# Patient Record
Sex: Female | Born: 1990 | Race: Asian | Hispanic: No | Marital: Married | Smoking: Never smoker
Health system: Southern US, Community
[De-identification: ages and names within clinical notes are randomized; demographics above are authoritative.]

## PROBLEM LIST (undated history)

## (undated) DIAGNOSIS — K219 Gastro-esophageal reflux disease without esophagitis: Secondary | ICD-10-CM

## (undated) DIAGNOSIS — R519 Headache, unspecified: Secondary | ICD-10-CM

## (undated) DIAGNOSIS — R51 Headache: Secondary | ICD-10-CM

## (undated) HISTORY — DX: Gastro-esophageal reflux disease without esophagitis: K21.9

## (undated) HISTORY — DX: Headache, unspecified: R51.9

## (undated) HISTORY — DX: Headache: R51

---

## 2018-03-01 ENCOUNTER — Emergency Department (HOSPITAL_COMMUNITY)
Admission: EM | Admit: 2018-03-01 | Discharge: 2018-03-01 | Disposition: A | Discharge status: Home / Self Care | Payer: Self-pay | Attending: Emergency Medicine | Admitting: Emergency Medicine

## 2018-03-01 ENCOUNTER — Encounter (HOSPITAL_COMMUNITY): Payer: Self-pay | Admitting: Emergency Medicine

## 2018-03-01 DIAGNOSIS — R519 Headache, unspecified: Secondary | ICD-10-CM

## 2018-03-01 DIAGNOSIS — R51 Headache: Secondary | ICD-10-CM | POA: Insufficient documentation

## 2018-03-01 MED ORDER — SODIUM CHLORIDE 0.9 % IV BOLUS
1000.0000 mL | Freq: Once | INTRAVENOUS | Status: AC
  Administered 2018-03-01: 1000 mL via INTRAVENOUS

## 2018-03-01 MED ORDER — DEXAMETHASONE SODIUM PHOSPHATE 10 MG/ML IJ SOLN
10.0000 mg | Freq: Once | INTRAMUSCULAR | Status: AC
  Administered 2018-03-01: 10 mg via INTRAVENOUS
  Filled 2018-03-01: qty 1

## 2018-03-01 MED ORDER — KETOROLAC TROMETHAMINE 30 MG/ML IJ SOLN
30.0000 mg | Freq: Once | INTRAMUSCULAR | Status: AC
  Administered 2018-03-01: 30 mg via INTRAVENOUS
  Filled 2018-03-01: qty 1

## 2018-03-01 MED ORDER — BUTALBITAL-APAP-CAFFEINE 50-325-40 MG PO TABS: 1.0000 | ORAL_TABLET | Freq: Three times a day (TID) | ORAL | 0 refills | Status: AC | PRN

## 2018-03-01 MED ORDER — METOCLOPRAMIDE HCL 5 MG/ML IJ SOLN
10.0000 mg | INTRAMUSCULAR | Status: AC
  Administered 2018-03-01: 10 mg via INTRAVENOUS
  Filled 2018-03-01: qty 2

## 2018-03-01 NOTE — ED Triage Notes (Signed)
Pt reports HA X several weeks, has been seen at [facility] for same and was told "nothing is wrong"

## 2018-03-01 NOTE — ED Provider Notes (Signed)
[facility] Provider Note   CSN: [id number] Arrival date & time: 03/01/18  0104    History   Chief Complaint Chief Complaint  Patient presents with  . Headache    HPI Allison Chan is a 27 y.o. female.  27 year old female presents to the emergency department for evaluation of headache.  She has had an intermittent headache over the past several weeks.  It is throbbing in nature and radiates from her occiput to her frontal region.  She notes intermittent dizziness as well as blurry vision.  Symptoms also associated with nausea.  She has not had any vomiting.  The patient was diagnosed a triptan following initial ED visit for evaluation of headache.  She has been taking this medication, but states it provides little relief.  She denies any recent head injury, trauma, fevers, extremity numbness or paresthesias.  She has also had a recent head CT which did not show any concerning process.  Patient states that she last had a similar headache many years ago.     History reviewed. No pertinent past medical history.  There are no active problems to display for this patient.   History reviewed. No pertinent surgical history.   OB History   None      Home Medications    Prior to Admission medications   Medication Sig Start Date End Date Taking? Authorizing Provider  butalbital-acetaminophen-caffeine (FIORICET, ESGIC) 323 120 267750-325-40 MG tablet Take 1-2 tablets by mouth every 8 (eight) hours as needed for headache. 03/01/18 03/01/19  Antony MaduraHumes, Tenley Winward, PA-C    Family History No family history on file.  Social History Social History   Tobacco Use  . Smoking status: Never Smoker  . Smokeless tobacco: Never Used  Substance Use Topics  . Alcohol use: Not Currently  . Drug use: Not Currently     Allergies   Patient has no known allergies.   Review of Systems Review of Systems Ten systems reviewed and are negative for acute change, except as noted  in the HPI.    Physical Exam Updated Vital Signs BP 120/80   Pulse 86   Temp 98.4 F (36.9 C) (Oral)   Resp 18   Ht 4\' 8"  (1.422 m)   Wt 49.9 kg (110 lb)   SpO2 100%   BMI 24.66 kg/m   Physical Exam  Constitutional: She is oriented to person, place, and time. She appears well-developed and well-nourished. No distress.  Nontoxic appearing and in NAD  HENT:  Head: Normocephalic and atraumatic.  Mouth/Throat: Oropharynx is clear and moist.  Symmetric rise of the uvula with phonation.  Eyes: Pupils are equal, round, and reactive to light. Conjunctivae and EOM are normal. No scleral icterus.  Neck: Normal range of motion.  No nuchal rigidity or meningismus  Cardiovascular: Normal rate, regular rhythm and intact distal pulses.  Pulmonary/Chest: Effort normal. No respiratory distress.  Respirations even and unlabored  Musculoskeletal: Normal range of motion.  Neurological: She is alert and oriented to person, place, and time. No cranial nerve deficit. She exhibits normal muscle tone. Coordination normal.  GCS 15. Speech is goal oriented. No cranial nerve deficits appreciated; symmetric eyebrow raise, no facial drooping, tongue midline. Patient has equal grip strength bilaterally with 5/5 strength against resistance in all major muscle groups bilaterally. Sensation to light touch intact. Patient moves extremities without ataxia. Patient ambulatory with steady gait.  Skin: Skin is warm and dry. No rash noted. She is not diaphoretic. No erythema. No pallor.  Psychiatric: She has a normal mood and affect. Her behavior is normal.  Nursing note and vitals reviewed.    ED Treatments / Results  Labs (all labs ordered are listed, but only abnormal results are displayed) Labs Reviewed - No data to display  EKG None  Radiology No results found.  Procedures Procedures (including critical care time)  Medications Ordered in ED Medications  metoCLOPramide (REGLAN) injection 10 mg (10  mg Intravenous Given 03/01/18 0505)  ketorolac (TORADOL) 30 MG/ML injection 30 mg (30 mg Intravenous Given 03/01/18 0503)  sodium chloride 0.9 % bolus 1,000 mL (0 mLs Intravenous Stopped 03/01/18 0627)  dexamethasone (DECADRON) injection 10 mg (10 mg Intravenous Given 03/01/18 0651)    6:57 AM Patient reassessed.  Reports that headache has completely resolved.  No clinical decompensation.  Vitals stable.   Initial Impression / Assessment and Plan / ED Course  I have reviewed the triage vital signs and the nursing notes.  Pertinent labs & imaging results that were available during my care of the patient were reviewed by me and considered in my medical decision making (see chart for details).     Patient presents to the emergency department for evaluation of headache which began 2 weeks ago.  Patient with no history of recent head injury or trauma.  No fever, nuchal rigidity, meningismus to suggest meningitis.  Neurologic exam today is nonfocal.  She had a normal, reassuring head CT 1 week ago.  Has been seen in the ED x 2 previously for similar complaints.  On reassessment, the patient has had significant improvement in headache symptoms following a migraine cocktail.  I do not believe further emergent workup is indicated at this time.  Return precautions discussed and provided.  Patient discharged in stable condition with no unaddressed concerns.   Final Clinical Impressions(s) / ED Diagnoses   Final diagnoses:  Bad headache    ED Discharge Orders        Ordered    butalbital-acetaminophen-caffeine (FIORICET, ESGIC) 50-325-40 MG tablet  Every 8 hours PRN     03/01/18 0653       Antony Madura, PA-C 03/01/18 0733    Palumbo, April, MD 03/01/18 [phone]

## 2018-03-15 ENCOUNTER — Emergency Department (HOSPITAL_COMMUNITY)
Admission: EM | Admit: 2018-03-15 | Discharge: 2018-03-15 | Disposition: A | Discharge status: Home / Self Care | Payer: Self-pay | Attending: Emergency Medicine | Admitting: Emergency Medicine

## 2018-03-15 ENCOUNTER — Emergency Department (HOSPITAL_COMMUNITY): Payer: Self-pay

## 2018-03-15 ENCOUNTER — Encounter (HOSPITAL_COMMUNITY): Payer: Self-pay

## 2018-03-15 ENCOUNTER — Other Ambulatory Visit: Payer: Self-pay

## 2018-03-15 DIAGNOSIS — K2900 Acute gastritis without bleeding: Secondary | ICD-10-CM | POA: Insufficient documentation

## 2018-03-15 DIAGNOSIS — Z79899 Other long term (current) drug therapy: Secondary | ICD-10-CM | POA: Insufficient documentation

## 2018-03-15 LAB — CBC WITH DIFFERENTIAL/PLATELET
Abs Immature Granulocytes: 0 10*3/uL (ref 0.0–0.1)
Basophils Absolute: 0 10*3/uL (ref 0.0–0.1)
Basophils Relative: 0 %
Eosinophils Absolute: 0.3 10*3/uL (ref 0.0–0.7)
Eosinophils Relative: 5 %
HCT: 34 % — ABNORMAL LOW (ref 36.0–46.0)
Hemoglobin: 10.8 g/dL — ABNORMAL LOW (ref 12.0–15.0)
Immature Granulocytes: 0 %
Lymphocytes Relative: 34 %
Lymphs Abs: 2 10*3/uL (ref 0.7–4.0)
MCH: 25.1 pg — ABNORMAL LOW (ref 26.0–34.0)
MCHC: 31.8 g/dL (ref 30.0–36.0)
MCV: 79.1 fL (ref 78.0–100.0)
Monocytes Absolute: 0.4 10*3/uL (ref 0.1–1.0)
Monocytes Relative: 7 %
Neutro Abs: 3.3 10*3/uL (ref 1.7–7.7)
Neutrophils Relative %: 54 %
Platelets: 219 10*3/uL (ref 150–400)
RBC: 4.3 MIL/uL (ref 3.87–5.11)
RDW: 14.9 % (ref 11.5–15.5)
WBC: 6 10*3/uL (ref 4.0–10.5)

## 2018-03-15 LAB — URINALYSIS, ROUTINE W REFLEX MICROSCOPIC
Bilirubin Urine: NEGATIVE
Glucose, UA: NEGATIVE mg/dL
Hgb urine dipstick: NEGATIVE
Ketones, ur: NEGATIVE mg/dL
Leukocytes, UA: NEGATIVE
Nitrite: NEGATIVE
Protein, ur: NEGATIVE mg/dL
Specific Gravity, Urine: 1.004 — ABNORMAL LOW (ref 1.005–1.030)
pH: 6 (ref 5.0–8.0)

## 2018-03-15 LAB — COMPREHENSIVE METABOLIC PANEL
ALT: 13 U/L (ref 0–44)
AST: 16 U/L (ref 15–41)
Albumin: 3.9 g/dL (ref 3.5–5.0)
Alkaline Phosphatase: 88 U/L (ref 38–126)
Anion gap: 6 (ref 5–15)
BUN: 6 mg/dL (ref 6–20)
CO2: 26 mmol/L (ref 22–32)
Calcium: 9.3 mg/dL (ref 8.9–10.3)
Chloride: 110 mmol/L (ref 98–111)
Creatinine, Ser: 0.59 mg/dL (ref 0.44–1.00)
GFR calc Af Amer: >60 mL/min (ref >60)
GFR calc non Af Amer: >60 mL/min (ref >60)
Glucose, Bld: 92 mg/dL (ref 70–99)
Potassium: 3.3 mmol/L — ABNORMAL LOW (ref 3.5–5.1)
Sodium: 142 mmol/L (ref 135–145)
Total Bilirubin: 0.5 mg/dL (ref 0.3–1.2)
Total Protein: 6.8 g/dL (ref 6.5–8.1)

## 2018-03-15 LAB — LIPASE, BLOOD: Lipase: 39 U/L (ref 11–51)

## 2018-03-15 LAB — I-STAT BETA HCG BLOOD, ED (MC, WL, AP ONLY): I-stat hCG, quantitative: <5 m[IU]/mL (ref <5)

## 2018-03-15 MED ORDER — SODIUM CHLORIDE 0.9 % IV BOLUS
1000.0000 mL | Freq: Once | INTRAVENOUS | Status: AC
  Administered 2018-03-15: 1000 mL via INTRAVENOUS

## 2018-03-15 MED ORDER — SUCRALFATE 1 G PO TABS
1.0000 g | ORAL_TABLET | Freq: Once | ORAL | Status: AC
  Administered 2018-03-15: 1 g via ORAL
  Filled 2018-03-15: qty 1

## 2018-03-15 MED ORDER — IOHEXOL 300 MG/ML  SOLN
100.0000 mL | Freq: Once | INTRAMUSCULAR | Status: AC | PRN
  Administered 2018-03-15: 100 mL via INTRAVENOUS

## 2018-03-15 MED ORDER — SUCRALFATE 1 G PO TABS: 1.0000 g | ORAL_TABLET | Freq: Three times a day (TID) | ORAL | 0 refills | Status: AC

## 2018-03-15 NOTE — ED Triage Notes (Signed)
Pt reports having abdominal pain and burning since Thursday. Pt has been taking omeprazole with no relief.

## 2018-03-15 NOTE — ED Notes (Signed)
Patient transported to CT 

## 2018-03-15 NOTE — ED Provider Notes (Signed)
[facility] pending.  Patient here complaining of epigastric discomfort and burning.  2 days of omeprazole without improvement.  Patient's CT showed physiologic ovarian cyst but no other abdominal pathology.  Mention was made of small bilateral pleural effusions are nonspecific but patient is denying any shortness of breath cough or chest pain.  Encourage patient to continue the omeprazole as well as she was given Carafate.  She was given follow-up with PCP if symptoms do not improve and recommended that she see GI if symptoms persist.  Patient and family given strict return precautions.   Gwyneth SproutPlunkett, Jaryn Rosko, MD 03/15/18 [phone]

## 2018-03-15 NOTE — ED Provider Notes (Signed)
[facility] Provider Note   CSN: [id number] Arrival date & time: 03/15/18  1105     History   Chief Complaint Chief Complaint  Patient presents with  . Abdominal Pain    HPI Allison Chan is a 27 y.o. female.  HPI Patient presents to the emergency department with right to mid abdominal pain that started Thursday.  The patient states she feels a burning sensation in her abdomen.  She states that she is had this in the past with relief with omeprazole.  The patient states that nothing seems make the condition worse.  Patient states she has not had any nausea vomiting or diarrhea.  Patient also denies having any fevers.  Patient did not take any other medications prior to arrival for her symptoms.  The patient denies chest pain, shortness of breath, headache,blurred vision, neck pain, fever, cough, weakness, numbness, dizziness, anorexia, edema, nausea, vomiting, diarrhea, rash, back pain, dysuria, hematemesis, bloody stool, near syncope, or syncope. History reviewed. No pertinent past medical history.  There are no active problems to display for this patient.   History reviewed. No pertinent surgical history.   OB History   None      Home Medications    Prior to Admission medications   Medication Sig Start Date End Date Taking? Authorizing Provider  omeprazole (PRILOSEC) 20 MG capsule Take 20 mg by mouth daily. 03/13/18  Yes [provider]  Potassium Chloride ER 20 MEQ TBCR Take 20 mEq by mouth 2 (two) times daily. 03/13/18  Yes [provider]  butalbital-acetaminophen-caffeine (FIORICET, ESGIC) 50-325-40 MG tablet Take 1-2 tablets by mouth every 8 (eight) hours as needed for headache. Patient not taking: Reported on 03/15/2018 03/01/18 03/01/19  Antony MaduraHumes, Kelly, PA-C    Family History No family history on file.  Social History Social History   Tobacco Use  . Smoking status: Never Smoker  . Smokeless tobacco: Never Used    Substance Use Topics  . Alcohol use: Not Currently  . Drug use: Not Currently     Allergies   Patient has no known allergies.   Review of Systems Review of Systems All other systems negative except as documented in the HPI. All pertinent positives and negatives as reviewed in the HPI. Physical Exam Updated Vital Signs BP 119/82   Pulse 66   Temp 97.9 F (36.6 C) (Oral)   Resp 14   Ht 4\' 8"  (1.422 m)   Wt 43.1 kg   SpO2 100%   BMI 21.30 kg/m   Physical Exam  Constitutional: She is oriented to person, place, and time. She appears well-developed and well-nourished. No distress.  HENT:  Head: Normocephalic and atraumatic.  Mouth/Throat: Oropharynx is clear and moist.  Eyes: Pupils are equal, round, and reactive to light.  Neck: Normal range of motion. Neck supple.  Cardiovascular: Normal rate, regular rhythm and normal heart sounds. Exam reveals no gallop and no friction rub.  No murmur heard. Pulmonary/Chest: Effort normal and breath sounds normal. No respiratory distress. She has no wheezes.  Abdominal: Soft. Bowel sounds are normal. She exhibits no distension. There is no hepatosplenomegaly. There is tenderness in the right upper quadrant, right lower quadrant and periumbilical area. There is no rigidity, no guarding and no tenderness at McBurney's point. No hernia.  Neurological: She is alert and oriented to person, place, and time. She exhibits normal muscle tone. Coordination normal.  Skin: Skin is warm and dry. Capillary refill takes less than 2 seconds. No  rash noted. No erythema.  Psychiatric: She has a normal mood and affect. Her behavior is normal.  Nursing note and vitals reviewed.    ED Treatments / Results  Labs (all labs ordered are listed, but only abnormal results are displayed) Labs Reviewed  CBC WITH DIFFERENTIAL/PLATELET - Abnormal; Notable for the following components:      Result Value   Hemoglobin 10.8 (*)    HCT 34.0 (*)    MCH 25.1 (*)     All other components within normal limits  COMPREHENSIVE METABOLIC PANEL - Abnormal; Notable for the following components:   Potassium 3.3 (*)    All other components within normal limits  URINALYSIS, ROUTINE W REFLEX MICROSCOPIC - Abnormal; Notable for the following components:   Color, Urine STRAW (*)    Specific Gravity, Urine 1.004 (*)    All other components within normal limits  LIPASE, BLOOD  I-STAT BETA HCG BLOOD, ED ([facility], [facility], [facility] ONLY)    EKG None  Radiology No results found.  Procedures Procedures (including critical care time)  Medications Ordered in ED Medications  sodium chloride 0.9 % bolus 1,000 mL (0 mLs Intravenous Stopped 03/15/18 1319)     Initial Impression / Assessment and Plan / ED Course  I have reviewed the triage vital signs and the nursing notes.  Pertinent labs & imaging results that were available during my care of the patient were reviewed by me and considered in my medical decision making (see chart for details).     Patient will have CT scan to look for any acute abdominal issues that are causing her symptoms.  Patient's testing so far has not shown any significant abnormalities.  Will await for CT scan results.  Final Clinical Impressions(s) / ED Diagnoses   Final diagnoses:  None    ED Discharge Orders    None       Charlestine Night, PA-C 03/15/18 1529    Allison Core, MD 03/15/18 [phone]

## 2018-03-18 ENCOUNTER — Encounter: Payer: Self-pay | Admitting: Neurology

## 2018-03-18 ENCOUNTER — Ambulatory Visit: Payer: Self-pay | Admitting: Neurology

## 2018-03-18 VITALS — BP 122/79 | HR 72 | Ht <= 58 in | Wt 96.0 lb

## 2018-03-18 DIAGNOSIS — IMO0002 Reserved for concepts with insufficient information to code with codable children: Secondary | ICD-10-CM | POA: Insufficient documentation

## 2018-03-18 DIAGNOSIS — G43709 Chronic migraine without aura, not intractable, without status migrainosus: Secondary | ICD-10-CM

## 2018-03-18 MED ORDER — SUMATRIPTAN SUCCINATE 50 MG PO TABS: 50.0000 mg | ORAL_TABLET | ORAL | 6 refills | Status: AC | PRN

## 2018-03-18 NOTE — Progress Notes (Signed)
PATIENT: Allison Chan DOB: [birth year]  Chief Complaint  Patient presents with  . Headache    She is here with her sister, Mio and an intrepreter from [place].  She went about a month having headaches daily with the pain being worse at night.  She was prescribed Fioricet which worked well.  States her headaches have now resolved.  . No PCP    Referred from ED.     HISTORICAL  Allison Chan is a 27 year old female, seen in request by emergency room for evaluation of headaches, she is accompanied by her sister, interpreter and 2 young children at today's clinical visit.  She is a immigrant of [place], complains of headaches since July 2019, she still nursing her 28-year-old daughter.  Her typical headache starting at the right neck area spreading forward, right frontal pounding headache with associated light noise sensitivity, nauseous, lasting for hours, usually happens at nighttime, sleep usually helps, she has tried over-the-counter medication without helping her symptoms.  CT head without contrast was normal on February 22 2018.  [facility] showed normal CMP, CBC, Hg 12.2  REVIEW OF SYSTEMS: Full 14 system review of systems performed and notable only for as above.  ALLERGIES: No Known Allergies  HOME MEDICATIONS: Current Outpatient Medications  Medication Sig Dispense Refill  . butalbital-acetaminophen-caffeine (FIORICET, ESGIC) 50-325-40 MG tablet Take 1-2 tablets by mouth every 8 (eight) hours as needed for headache. 20 tablet 0  . omeprazole (PRILOSEC) 20 MG capsule Take 20 mg by mouth daily.    . Potassium Chloride ER 20 MEQ TBCR Take 20 mEq by mouth 2 (two) times daily.    . sucralfate (CARAFATE) 1 g tablet Take 1 tablet (1 g total) by mouth 4 (four) times daily -  with meals and at bedtime. 90 tablet 0   No current facility-administered medications for this visit.     PAST MEDICAL HISTORY: Past Medical History:  Diagnosis Date  . Acid reflux   .  Frequent headaches     PAST SURGICAL HISTORY: History reviewed. No pertinent surgical history.  FAMILY HISTORY: Family History  Problem Relation Age of Onset  . Other Mother        unaware of any health problems  . Other Father        unaware of any health problems    SOCIAL HISTORY: Social History   Socioeconomic History  . Marital status: Married    Spouse name: Not on file  . Number of children: 3  . Years of education: 9th grade  . Highest education level: Not on file  Occupational History  . Occupation: Unemployed  Social Needs  . Financial resource strain: Not on file  . Food insecurity:    Worry: Not on file    Inability: Not on file  . Transportation needs:    Medical: Not on file    Non-medical: Not on file  Tobacco Use  . Smoking status: Never Smoker  . Smokeless tobacco: Never Used  Substance and Sexual Activity  . Alcohol use: Not Currently  . Drug use: Not Currently  . Sexual activity: Not on file  Lifestyle  . Physical activity:    Days per week: Not on file    Minutes per session: Not on file  . Stress: Not on file  Relationships  . Social connections:    Talks on phone: Not on file    Gets together: Not on file    Attends religious  service: Not on file    Active member of club or organization: Not on file    Attends meetings of clubs or organizations: Not on file    Relationship status: Not on file  . Intimate partner violence:    Fear of current or ex partner: Not on file    Emotionally abused: Not on file    Physically abused: Not on file    Forced sexual activity: Not on file  Other Topics Concern  . Not on file  Social History Narrative   Lives with her husband and children.   Right-handed.   No caffeine use.     PHYSICAL EXAM   Vitals:   03/18/18 0740  BP: 122/79  Pulse: 72  Weight: 96 lb (43.5 kg)  Height: 4\' 8"  (1.422 m)    Not recorded      Body mass index is 21.52 kg/m.  PHYSICAL EXAMNIATION:  Gen: NAD,  conversant, well nourised, obese, well groomed                     Cardiovascular: Regular rate rhythm, no peripheral edema, warm, nontender. Eyes: Conjunctivae clear without exudates or hemorrhage Neck: Supple, no carotid bruits. Pulmonary: Clear to auscultation bilaterally   NEUROLOGICAL EXAM:  MENTAL STATUS: Speech:    Speech is normal; fluent and spontaneous with normal comprehension.  Cognition:     Orientation to time, place and person     Normal recent and remote memory     Normal Attention span and concentration     Normal Language, naming, repeating,spontaneous speech     Fund of knowledge   CRANIAL NERVES: CN II: Visual fields are full to confrontation. Fundoscopic exam is normal with sharp discs and no vascular changes. Pupils are round equal and briskly reactive to light. CN III, IV, VI: extraocular movement are normal. No ptosis. CN V: Facial sensation is intact to pinprick in all 3 divisions bilaterally. Corneal responses are intact.  CN VII: Face is symmetric with normal eye closure and smile. CN VIII: Hearing is normal to rubbing fingers CN IX, X: Palate elevates symmetrically. Phonation is normal. CN XI: Head turning and shoulder shrug are intact CN XII: Tongue is midline with normal movements and no atrophy.  MOTOR: There is no pronator drift of out-stretched arms. Muscle bulk and tone are normal. Muscle strength is normal.  REFLEXES: Reflexes are 2+ and symmetric at the biceps, triceps, knees, and ankles. Plantar responses are flexor.  SENSORY: Intact to light touch, pinprick, positional sensation and vibratory sensation are intact in fingers and toes.  COORDINATION: Rapid alternating movements and fine finger movements are intact. There is no dysmetria on finger-to-nose and heel-knee-shin.    GAIT/STANCE: Posture is normal. Gait is steady with normal steps, base, arm swing, and turning. Heel and toe walking are normal. Tandem gait is normal.  Romberg is  absent.   DIAGNOSTIC DATA (LABS, IMAGING, TESTING) - I reviewed patient records, labs, notes, testing and imaging myself where available.   ASSESSMENT AND PLAN  Allison Chan is a 27 y.o. female   Chronic migraine  imitrex 50mg  prn  Discard breast milk for 24 hours after imitrex dose.   Levert FeinsteinYijun Bart Ashford, M.D. Ph.D.  [facility] [address], [address] [place], [place] [address] Ph: [phone]) [phone] Fax: [phone]  CC: Referring Provider

## 2018-05-01 ENCOUNTER — Emergency Department (HOSPITAL_COMMUNITY)
Admission: EM | Admit: 2018-05-01 | Discharge: 2018-05-02 | Disposition: A | Discharge status: Home / Self Care | Payer: Medicaid Other | Attending: Emergency Medicine | Admitting: Emergency Medicine

## 2018-05-01 ENCOUNTER — Other Ambulatory Visit: Payer: Self-pay

## 2018-05-01 ENCOUNTER — Encounter (HOSPITAL_COMMUNITY): Payer: Self-pay | Admitting: Emergency Medicine

## 2018-05-01 DIAGNOSIS — R208 Other disturbances of skin sensation: Secondary | ICD-10-CM | POA: Insufficient documentation

## 2018-05-01 DIAGNOSIS — Z79899 Other long term (current) drug therapy: Secondary | ICD-10-CM | POA: Insufficient documentation

## 2018-05-01 LAB — CBC WITH DIFFERENTIAL/PLATELET
Abs Immature Granulocytes: 0 10*3/uL (ref 0.0–0.1)
BASOS ABS: 0 10*3/uL (ref 0.0–0.1)
Basophils Relative: 0 %
EOS PCT: 3 %
Eosinophils Absolute: 0.3 10*3/uL (ref 0.0–0.7)
HCT: 35 % — ABNORMAL LOW (ref 36.0–46.0)
HEMOGLOBIN: 11.2 g/dL — AB (ref 12.0–15.0)
Immature Granulocytes: 0 %
LYMPHS PCT: 37 %
Lymphs Abs: 3.4 10*3/uL (ref 0.7–4.0)
MCH: 24.8 pg — ABNORMAL LOW (ref 26.0–34.0)
MCHC: 32 g/dL (ref 30.0–36.0)
MCV: 77.4 fL — ABNORMAL LOW (ref 78.0–100.0)
Monocytes Absolute: 0.6 10*3/uL (ref 0.1–1.0)
Monocytes Relative: 7 %
Neutro Abs: 4.8 10*3/uL (ref 1.7–7.7)
Neutrophils Relative %: 53 %
Platelets: 310 10*3/uL (ref 150–400)
RBC: 4.52 MIL/uL (ref 3.87–5.11)
RDW: 14.6 % (ref 11.5–15.5)
WBC: 9.1 10*3/uL (ref 4.0–10.5)

## 2018-05-01 LAB — URINALYSIS, ROUTINE W REFLEX MICROSCOPIC
BILIRUBIN URINE: NEGATIVE
Glucose, UA: NEGATIVE mg/dL
Hgb urine dipstick: NEGATIVE
KETONES UR: 20 mg/dL — AB
Leukocytes, UA: NEGATIVE
NITRITE: NEGATIVE
PROTEIN: NEGATIVE mg/dL
SPECIFIC GRAVITY, URINE: 1.009 (ref 1.005–1.030)
pH: 6 (ref 5.0–8.0)

## 2018-05-01 LAB — I-STAT BETA HCG BLOOD, ED (MC, WL, AP ONLY)

## 2018-05-01 NOTE — ED Triage Notes (Signed)
Patient reports generalized body aches and fatigue for 2 months with chills , denies fever .

## 2018-05-02 LAB — COMPREHENSIVE METABOLIC PANEL
ALBUMIN: 4.3 g/dL (ref 3.5–5.0)
ALT: 13 U/L (ref 0–44)
AST: 19 U/L (ref 15–41)
Alkaline Phosphatase: 90 U/L (ref 38–126)
Anion gap: 8 (ref 5–15)
BUN: 5 mg/dL — ABNORMAL LOW (ref 6–20)
CHLORIDE: 107 mmol/L (ref 98–111)
CO2: 24 mmol/L (ref 22–32)
Calcium: 9.8 mg/dL (ref 8.9–10.3)
Creatinine, Ser: 0.57 mg/dL (ref 0.44–1.00)
GFR calc non Af Amer: >60 mL/min (ref >60)
Glucose, Bld: 91 mg/dL (ref 70–99)
Potassium: 3.2 mmol/L — ABNORMAL LOW (ref 3.5–5.1)
SODIUM: 139 mmol/L (ref 135–145)
Total Bilirubin: 0.6 mg/dL (ref 0.3–1.2)
Total Protein: 7.2 g/dL (ref 6.5–8.1)

## 2018-05-02 NOTE — Discharge Instructions (Signed)
You may alternate Tylenol 1000 mg every 6 hours as needed for pain and Ibuprofen 800 mg every 8 hours as needed for pain.  Please take Ibuprofen with food.  Your labs today were reassuring.  There is no sign of emergent life-threatening illness present.  You can follow-up with a primary care provider as an outpatient for further evaluation.    To find a primary care or specialty doctor please call [phone] or [phone] to access "[place] Find a Doctor Service."  You may also go on the [facility] website at [email or web address]  There are also multiple [facility], [facility], [facility] and [facility] practices throughout the [place] that are frequently accepting new patients. You may find a clinic that is close to your home and contact them.  [facility] -  [address] [place] [address] [phone]   [facility] Department -  [address] [place] [address] [phone]   [facility] Department [phone]  [place] [address] [phone]

## 2018-05-02 NOTE — ED Provider Notes (Signed)
TIME SEEN: 4:01 AM  CHIEF COMPLAINT: Burning sensation  HPI: Patient is a 27 year old female with history of acid reflux who presents to the emergency department with a burning sensation throughout her entire body that has been intermittent for the past 2 months.  No aggravating or alleviating factors.  No chest pain or shortness of breath.  No abdominal pain.  No vomiting or diarrhea.  Denies any fever.  States that intermittently she will have sweating of her palms and feet.  She does not have a primary care provider.  ROS: See HPI Constitutional: no fever  Eyes: no drainage  ENT: no runny nose   Cardiovascular:  no chest pain  Resp: no SOB  GI: no vomiting GU: no dysuria Integumentary: no rash  Allergy: no hives  Musculoskeletal: no leg swelling  Neurological: no slurred speech ROS otherwise negative  PAST MEDICAL HISTORY/PAST SURGICAL HISTORY:  Past Medical History:  Diagnosis Date  . Acid reflux   . Frequent headaches     MEDICATIONS:  Prior to Admission medications   Medication Sig Start Date End Date Taking? Authorizing Provider  butalbital-acetaminophen-caffeine (FIORICET, ESGIC) [phone] MG tablet Take 1-2 tablets by mouth every 8 (eight) hours as needed for headache. 03/01/18 03/01/19  Antony Madura, PA-C  omeprazole (PRILOSEC) 20 MG capsule Take 20 mg by mouth daily. 03/13/18   [provider]  Potassium Chloride ER 20 MEQ TBCR Take 20 mEq by mouth 2 (two) times daily. 03/13/18   [provider]  sucralfate (CARAFATE) 1 g tablet Take 1 tablet (1 g total) by mouth 4 (four) times daily -  with meals and at bedtime. 03/15/18   Gwyneth Sprout, MD  SUMAtriptan (IMITREX) 50 MG tablet Take 1 tablet (50 mg total) by mouth every 2 (two) hours as needed for migraine. May repeat in 2 hours if headache persists or recurs. 03/18/18   Levert Feinstein, MD    ALLERGIES:  No Known Allergies  SOCIAL HISTORY:  Social History   Tobacco Use  . Smoking status: Never Smoker   . Smokeless tobacco: Never Used  Substance Use Topics  . Alcohol use: Not Currently    FAMILY HISTORY: Family History  Problem Relation Age of Onset  . Other Mother        unaware of any health problems  . Other Father        unaware of any health problems    EXAM: BP 120/88   Pulse 68   Temp 98.3 F (36.8 C) (Oral)   Resp 16   SpO2 98%  CONSTITUTIONAL: Alert and oriented and responds appropriately to questions. Well-appearing; well-nourished HEAD: Normocephalic EYES: Conjunctivae clear, pupils appear equal, EOMI ENT: normal nose; moist mucous membranes NECK: Supple, no meningismus, no nuchal rigidity, no LAD  CARD: RRR; S1 and S2 appreciated; no murmurs, no clicks, no rubs, no gallops RESP: Normal chest excursion without splinting or tachypnea; breath sounds clear and equal bilaterally; no wheezes, no rhonchi, no rales, no hypoxia or respiratory distress, speaking full sentences ABD/GI: Normal bowel sounds; non-distended; soft, non-tender, no rebound, no guarding, no peritoneal signs, no hepatosplenomegaly BACK:  The back appears normal and is non-tender to palpation, there is no CVA tenderness EXT: Normal ROM in all joints; non-tender to palpation; no edema; normal capillary refill; no cyanosis, no calf tenderness or swelling    SKIN: Normal color for age and race; warm; no rash NEURO: Moves all extremities equally PSYCH: The patient's mood and manner are appropriate. Grooming and personal hygiene are  appropriate.  MEDICAL DECISION MAKING: Patient here with nonspecific complaint of burning all over intermittently.  Asymptomatic currently with reassuring exam.  Labs, urine obtained in triage are unremarkable.  No fever.  Unclear etiology for patient's symptoms but nothing that seems to be life-threatening at this time.  Recommended outpatient follow-up with a primary care provider if symptoms continue.  Patient speaks montagnard.  Friend at bedside used as interpreter.  At  this time, I do not feel there is any life-threatening condition present. I have reviewed and discussed all results (EKG, imaging, lab, urine as appropriate) and exam findings with patient/family. I have reviewed nursing notes and appropriate previous records.  I feel the patient is safe to be discharged home without further emergent workup and can continue workup as an outpatient as needed. Discussed usual and customary return precautions. Patient/family verbalize understanding and are comfortable with this plan.  Outpatient follow-up has been provided if needed. All questions have been answered.      Kadarious Dikes, Layla Maw, DO 05/02/18 [phone]

## 2018-10-01 ENCOUNTER — Emergency Department (HOSPITAL_COMMUNITY): Payer: Self-pay

## 2018-10-01 ENCOUNTER — Emergency Department (HOSPITAL_COMMUNITY)
Admission: EM | Admit: 2018-10-01 | Discharge: 2018-10-01 | Disposition: A | Discharge status: Home / Self Care | Payer: Self-pay | Attending: Emergency Medicine | Admitting: Emergency Medicine

## 2018-10-01 ENCOUNTER — Encounter (HOSPITAL_COMMUNITY): Payer: Self-pay | Admitting: *Deleted

## 2018-10-01 ENCOUNTER — Other Ambulatory Visit: Payer: Self-pay

## 2018-10-01 DIAGNOSIS — R1011 Right upper quadrant pain: Secondary | ICD-10-CM

## 2018-10-01 DIAGNOSIS — K219 Gastro-esophageal reflux disease without esophagitis: Secondary | ICD-10-CM | POA: Insufficient documentation

## 2018-10-01 DIAGNOSIS — Z79899 Other long term (current) drug therapy: Secondary | ICD-10-CM | POA: Insufficient documentation

## 2018-10-01 LAB — COMPREHENSIVE METABOLIC PANEL WITH GFR
ALT: 32 U/L (ref 0–44)
AST: 31 U/L (ref 15–41)
Albumin: 4.3 g/dL (ref 3.5–5.0)
Alkaline Phosphatase: 89 U/L (ref 38–126)
Anion gap: 13 (ref 5–15)
BUN: 10 mg/dL (ref 6–20)
CO2: 21 mmol/L — ABNORMAL LOW (ref 22–32)
Calcium: 9.7 mg/dL (ref 8.9–10.3)
Chloride: 101 mmol/L (ref 98–111)
Creatinine, Ser: 0.65 mg/dL (ref 0.44–1.00)
GFR calc Af Amer: >60 mL/min
GFR calc non Af Amer: >60 mL/min
Glucose, Bld: 116 mg/dL — ABNORMAL HIGH (ref 70–99)
Potassium: 3.5 mmol/L (ref 3.5–5.1)
Sodium: 135 mmol/L (ref 135–145)
Total Bilirubin: 0.4 mg/dL (ref 0.3–1.2)
Total Protein: 8.1 g/dL (ref 6.5–8.1)

## 2018-10-01 LAB — I-STAT BETA HCG BLOOD, ED (MC, WL, AP ONLY): I-stat hCG, quantitative: <5 m[IU]/mL (ref <5)

## 2018-10-01 LAB — CBC
HEMATOCRIT: 37 % (ref 36.0–46.0)
Hemoglobin: 11.7 g/dL — ABNORMAL LOW (ref 12.0–15.0)
MCH: 24.3 pg — ABNORMAL LOW (ref 26.0–34.0)
MCHC: 31.6 g/dL (ref 30.0–36.0)
MCV: 76.9 fL — AB (ref 80.0–100.0)
NRBC: 0 % (ref 0.0–0.2)
Platelets: 318 10*3/uL (ref 150–400)
RBC: 4.81 MIL/uL (ref 3.87–5.11)
RDW: 14 % (ref 11.5–15.5)
WBC: 14.2 10*3/uL — ABNORMAL HIGH (ref 4.0–10.5)

## 2018-10-01 LAB — URINALYSIS, ROUTINE W REFLEX MICROSCOPIC
Bilirubin Urine: NEGATIVE
GLUCOSE, UA: NEGATIVE mg/dL
Hgb urine dipstick: NEGATIVE
KETONES UR: NEGATIVE mg/dL
LEUKOCYTE UA: NEGATIVE
Nitrite: NEGATIVE
PH: 6 (ref 5.0–8.0)
Protein, ur: NEGATIVE mg/dL
SPECIFIC GRAVITY, URINE: 1.012 (ref 1.005–1.030)

## 2018-10-01 LAB — LIPASE, BLOOD: Lipase: 31 U/L (ref 11–51)

## 2018-10-01 MED ORDER — ONDANSETRON 4 MG PO TBDP: 4.0000 mg | ORAL_TABLET | Freq: Four times a day (QID) | ORAL | 0 refills | Status: DC | PRN

## 2018-10-01 MED ORDER — ONDANSETRON HCL 4 MG/2ML IJ SOLN
4.0000 mg | Freq: Once | INTRAMUSCULAR | Status: AC
  Administered 2018-10-01: 4 mg via INTRAVENOUS
  Filled 2018-10-01: qty 2

## 2018-10-01 MED ORDER — FENTANYL CITRATE (PF) 100 MCG/2ML IJ SOLN
50.0000 ug | Freq: Once | INTRAMUSCULAR | Status: AC
  Administered 2018-10-01: 50 ug via INTRAVENOUS
  Filled 2018-10-01: qty 2

## 2018-10-01 MED ORDER — OMEPRAZOLE 40 MG PO CPDR: 40.0000 mg | DELAYED_RELEASE_CAPSULE | Freq: Every day | ORAL | 1 refills | Status: DC

## 2018-10-01 MED ORDER — LIDOCAINE VISCOUS HCL 2 % MT SOLN
15.0000 mL | Freq: Once | OROMUCOSAL | Status: AC
  Administered 2018-10-01: 15 mL via ORAL
  Filled 2018-10-01: qty 15

## 2018-10-01 MED ORDER — SODIUM CHLORIDE 0.9% FLUSH: 3.0000 mL | Freq: Once | INTRAVENOUS | Status: DC

## 2018-10-01 MED ORDER — ALUM & MAG HYDROXIDE-SIMETH 200-200-20 MG/5ML PO SUSP
30.0000 mL | Freq: Once | ORAL | Status: AC
  Administered 2018-10-01: 30 mL via ORAL
  Filled 2018-10-01: qty 30

## 2018-10-01 NOTE — ED Provider Notes (Signed)
TIME SEEN: 2:36 AM  CHIEF COMPLAINT: Abdominal pain  HPI: Patient is a 28 year old female with history of acid reflux who presents to the emergency department with right upper quadrant abdominal pain that radiates into her shoulder, subjective fevers at home that started 8 PM tonight.  Started after eating dinner.  She had chicken and rice for dinner tonight.  Has had nausea but no vomiting or diarrhea.  Took Tylenol prior to arrival.  States she did not check her temperature at home but "felt hot".  No abdominal surgeries.  Has had similar symptoms in the past.  Had a negative CT scan of her abdomen pelvis August 2019.  No aggravating or alleviating factors.  Patient speaks [place].  Family at bedside interpreting for patient.  ROS: See HPI Constitutional: no fever  Eyes: no drainage  ENT: no runny nose   Cardiovascular:  no chest pain  Resp: no SOB  GI: no vomiting GU: no dysuria Integumentary: no rash  Allergy: no hives  Musculoskeletal: no leg swelling  Neurological: no slurred speech ROS otherwise negative  PAST MEDICAL HISTORY/PAST SURGICAL HISTORY:  Past Medical History:  Diagnosis Date  . Acid reflux   . Frequent headaches     MEDICATIONS:  Prior to Admission medications   Medication Sig Start Date End Date Taking? Authorizing Provider  butalbital-acetaminophen-caffeine (FIORICET, ESGIC) [phone] MG tablet Take 1-2 tablets by mouth every 8 (eight) hours as needed for headache. 03/01/18 03/01/19  Antony Madura, PA-C  omeprazole (PRILOSEC) 20 MG capsule Take 20 mg by mouth daily. 03/13/18   [provider]  Potassium Chloride ER 20 MEQ TBCR Take 20 mEq by mouth 2 (two) times daily. 03/13/18   [provider]  sucralfate (CARAFATE) 1 g tablet Take 1 tablet (1 g total) by mouth 4 (four) times daily -  with meals and at bedtime. 03/15/18   Gwyneth Sprout, MD  SUMAtriptan (IMITREX) 50 MG tablet Take 1 tablet (50 mg total) by mouth every 2 (two) hours as needed  for migraine. May repeat in 2 hours if headache persists or recurs. 03/18/18   Levert Feinstein, MD    ALLERGIES:  No Known Allergies  SOCIAL HISTORY:  Social History   Tobacco Use  . Smoking status: Never Smoker  . Smokeless tobacco: Never Used  Substance Use Topics  . Alcohol use: Not Currently    FAMILY HISTORY: Family History  Problem Relation Age of Onset  . Other Mother        unaware of any health problems  . Other Father        unaware of any health problems    EXAM: BP (!) 148/73 (BP Location: Right Arm)   Pulse (!) 106   Temp 98.2 F (36.8 C)   Resp 17   Ht 4\' 8"  (1.422 m)   Wt 52.2 kg   SpO2 99%   BMI 25.78 kg/m  CONSTITUTIONAL: Alert and oriented and responds appropriately to questions. Well-appearing; well-nourished HEAD: Normocephalic EYES: Conjunctivae clear, pupils appear equal, EOMI ENT: normal nose; moist mucous membranes NECK: Supple, no meningismus, no nuchal rigidity, no LAD  CARD: RRR; S1 and S2 appreciated; no murmurs, no clicks, no rubs, no gallops RESP: Normal chest excursion without splinting or tachypnea; breath sounds clear and equal bilaterally; no wheezes, no rhonchi, no rales, no hypoxia or respiratory distress, speaking full sentences ABD/GI: Normal bowel sounds; non-distended; soft, ender to palpation in the right upper quadrant without voluntary guarding or rebound, no peritoneal signs, negative Murphy sign,  nontender at McBurney's point BACK:  The back appears normal and is non-tender to palpation, there is no CVA tenderness EXT: Normal ROM in all joints; non-tender to palpation; no edema; normal capillary refill; no cyanosis, no calf tenderness or swelling    SKIN: Normal color for age and race; warm; no rash NEURO: Moves all extremities equally PSYCH: The patient's mood and manner are appropriate. Grooming and personal hygiene are appropriate.  MEDICAL DECISION MAKING: Patient here with right upper quadrant pain that radiates into her  right shoulder.  Reports subjective fevers at home.  Took Tylenol prior to arrival and is now afebrile.  Labs unremarkable other than leukocytosis of 14,000.  LFTs, lipase normal.  I am concerned for cholecystitis, cholelithiasis, gastritis.  Will obtain right upper quadrant ultrasound.  Will give fentanyl, Zofran.  We will keep her n.p.o.  ED PROGRESS: Patient's right upper quadrant ultrasound is normal.  Will give GI cocktail and fluid challenge patient.  She has no tenderness at McBurney's point.  Doubt appendicitis.  No GU symptoms.   Patient reports feeling better.  Serial abdominal exams unremarkable.  No tenderness again at McBurney's point.  Suspect gastritis, GERD causing her symptoms.  Will discharge with Zofran, omeprazole.  Recommended diet changes.   At this time, I do not feel there is any life-threatening condition present. I have reviewed and discussed all results (EKG, imaging, lab, urine as appropriate) and exam findings with patient/family. I have reviewed nursing notes and appropriate previous records.  I feel the patient is safe to be discharged home without further emergent workup and can continue workup as an outpatient as needed. Discussed usual and customary return precautions. Patient/family verbalize understanding and are comfortable with this plan.  Outpatient follow-up has been provided as needed. All questions have been answered.     Mikka Kissner, Layla Maw, DO 10/01/18 [phone]

## 2018-10-01 NOTE — Discharge Instructions (Signed)
Please avoid NSAIDs such as aspirin (Goody powders), ibuprofen (Motrin, Advil), naproxen (Aleve) as these may worsen your symptoms.  Tylenol 1000 mg every 6 hours is safe to take as long as you have no history of liver problems (heavy alcohol use, cirrhosis, hepatitis).  Please avoid spicy, acidic (citrus fruits, tomato based sauces, salsa), greasy, fatty foods.  Please avoid caffeine and alcohol.    You may use over-the-counter medication such as Tums, Maalox or Mylanta, Pepcid as needed for heartburn.

## 2018-10-01 NOTE — ED Triage Notes (Signed)
To ED for eval of RUQ pain, fever, and right shoulder pain since this afternoon. Nausea, no vomiting. Denies pain with urination.

## 2018-10-01 NOTE — ED Notes (Signed)
Patient verbalized understanding of dc instructions, vss, ambulatory with nad.   

## 2018-10-01 NOTE — ED Notes (Signed)
Patient transported to Ultrasound 

## 2018-10-01 NOTE — ED Notes (Signed)
Pt sister at bedside to help with translation. Pt sister states she speaks [place], states it is an uncommon language derived from [place] she can understand some vietnamese but can not speak it well.

## 2018-10-01 NOTE — ED Notes (Signed)
ED Provider at bedside. 

## 2018-10-25 IMAGING — CT CT ABD-PELV W/ CM
2 of 4 series · 16 of 46 positions shown, 18 images · IV contrast (APPLIED)
Comparison: None.

CLINICAL DATA: Right flank and abdomen pain since [REDACTED]

EXAM:
CT ABDOMEN AND PELVIS WITH CONTRAST
TECHNIQUE: Multidetector CT imaging of the abdomen and pelvis was performed
using the standard protocol following bolus administration of
intravenous contrast.
CONTRAST:  100mL OMNIPAQUE IOHEXOL 300 MG/ML  SOLN

[Series 3: abd/ pelvis 5.0 i30f 2 · axial · 0.56mm/px · z∈[-556,-182]mm · 13 of 83 slices shown, 15 images]
[im 4/83  soft-tissue]
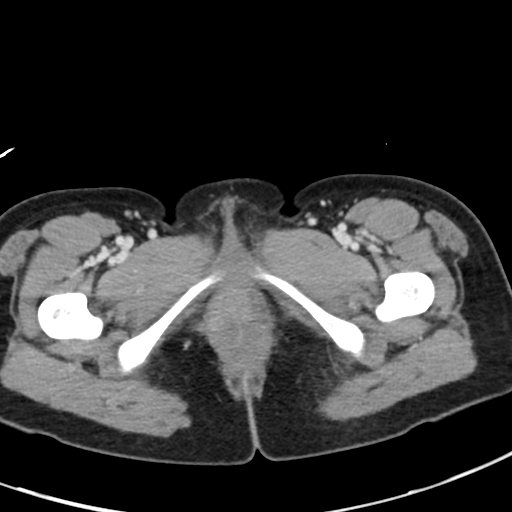
[im 4/83  bone]
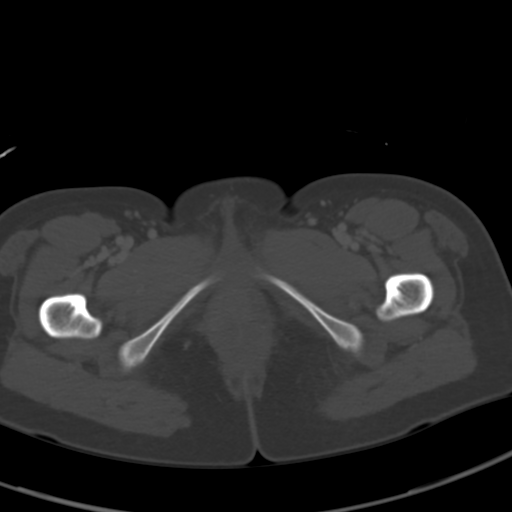
[im 10/83  soft-tissue]
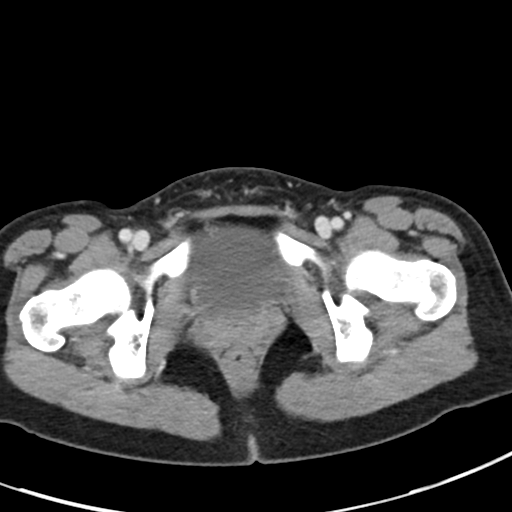
[im 16/83  soft-tissue]
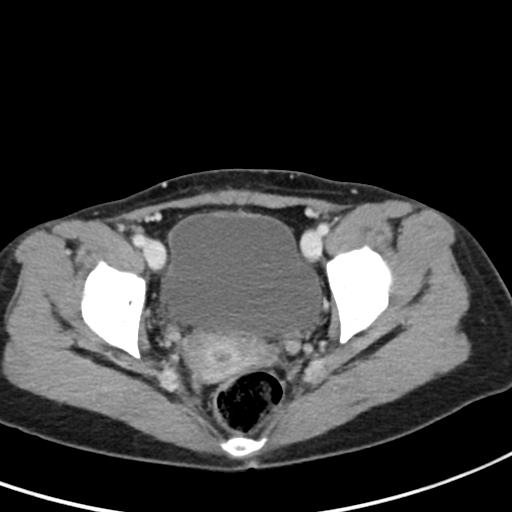
[im 23/83  soft-tissue]
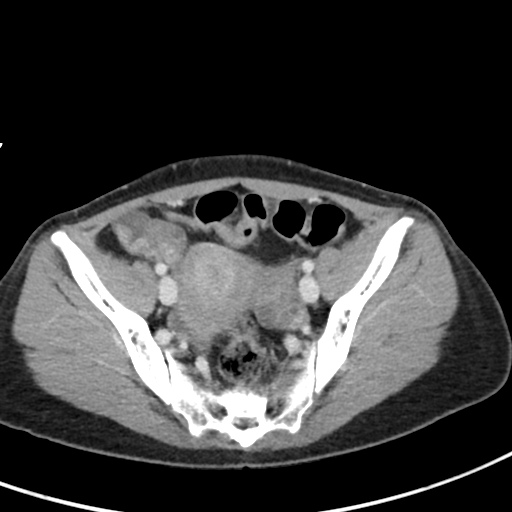
[im 29/83  soft-tissue]
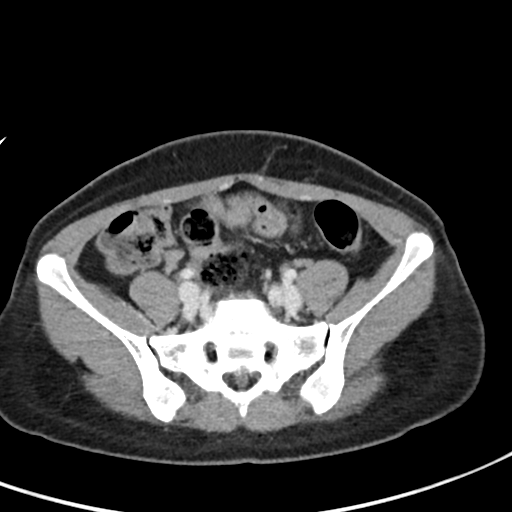
[im 35/83  soft-tissue]
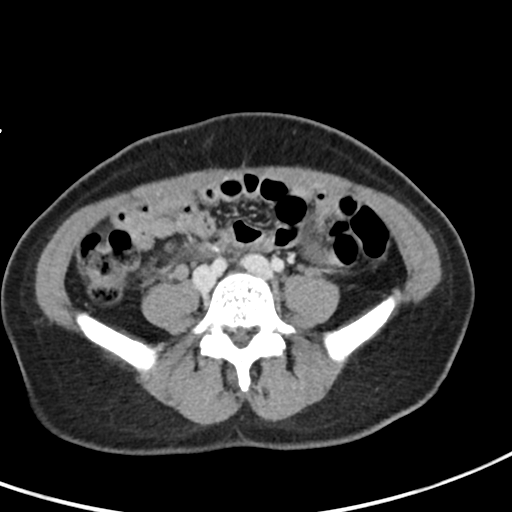
[im 42/83  soft-tissue]
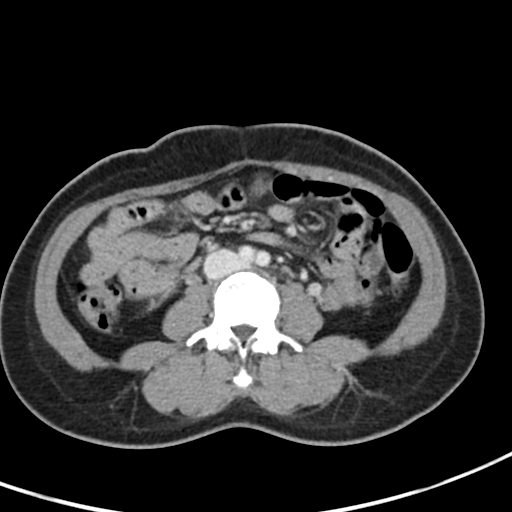
[im 48/83  soft-tissue]
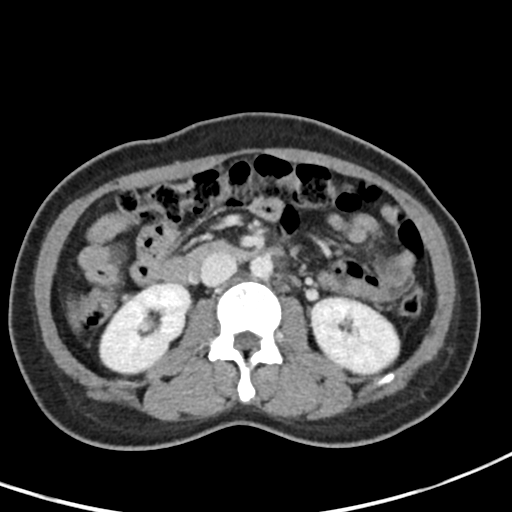
[im 54/83  soft-tissue]
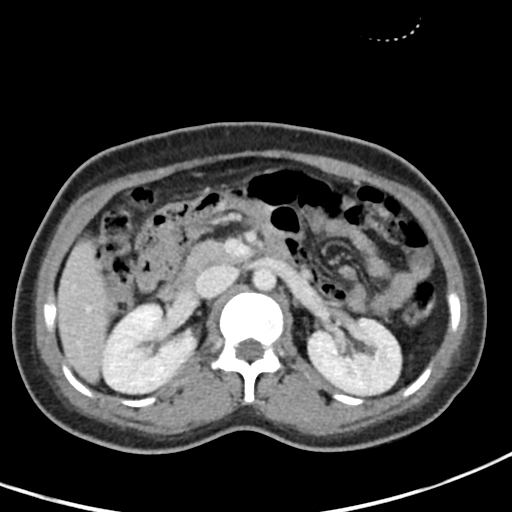
[im 54/83  bone]
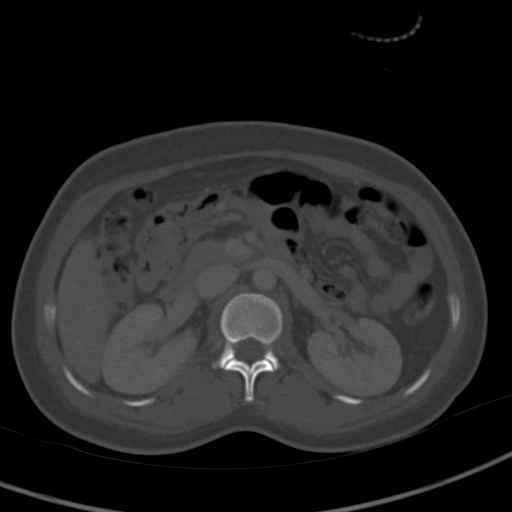
[im 60/83  soft-tissue]
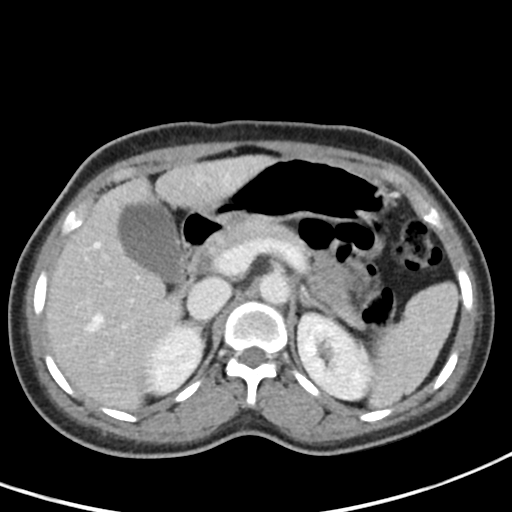
[im 67/83  soft-tissue]
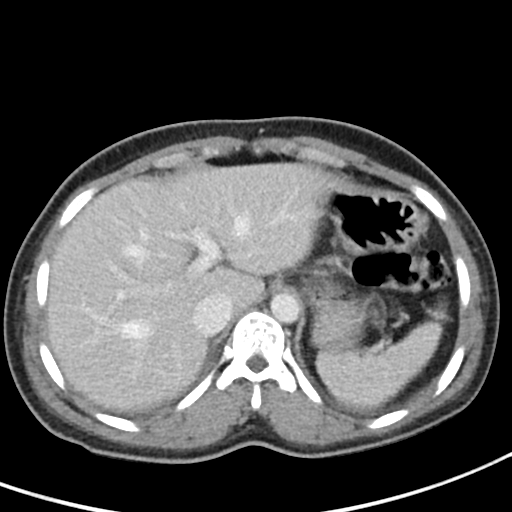
[im 73/83  soft-tissue]
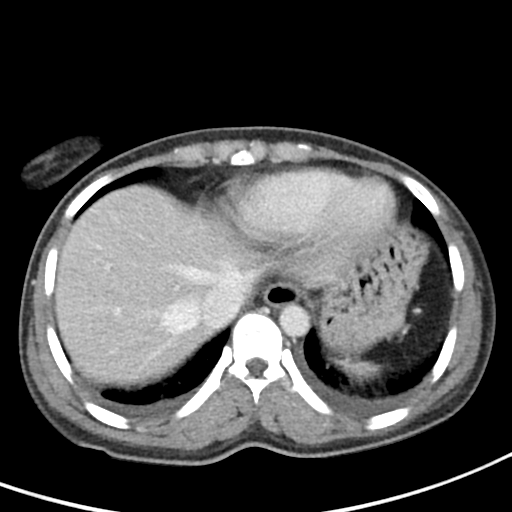
[im 79/83  soft-tissue]
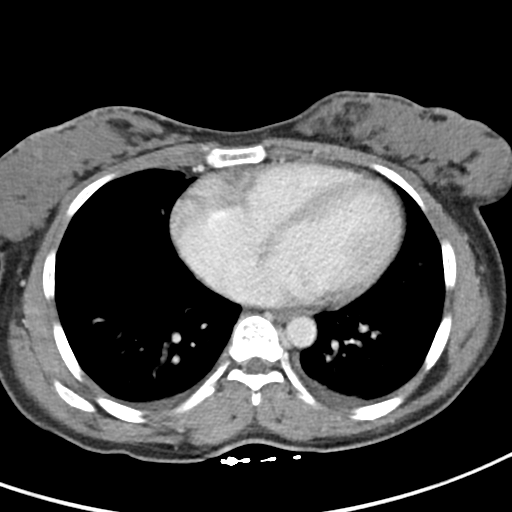

[Series 6: coronal soft tissue · coronal · 0.59mm/px · 3 of 96 slices shown]
[im 32/96  soft-tissue]
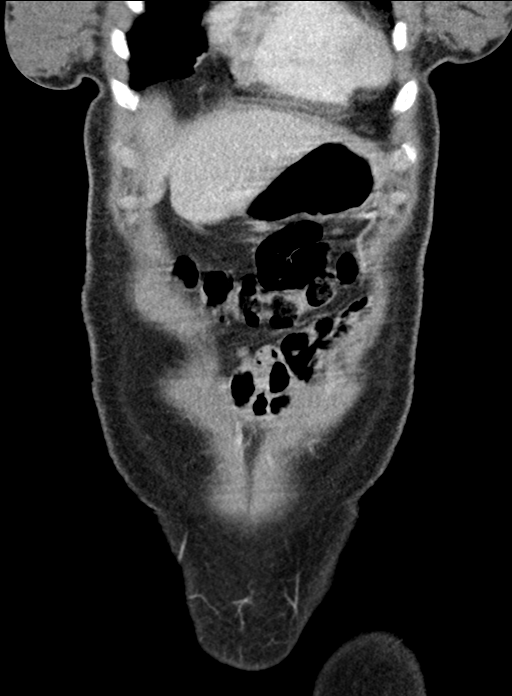
[im 43/96  soft-tissue]
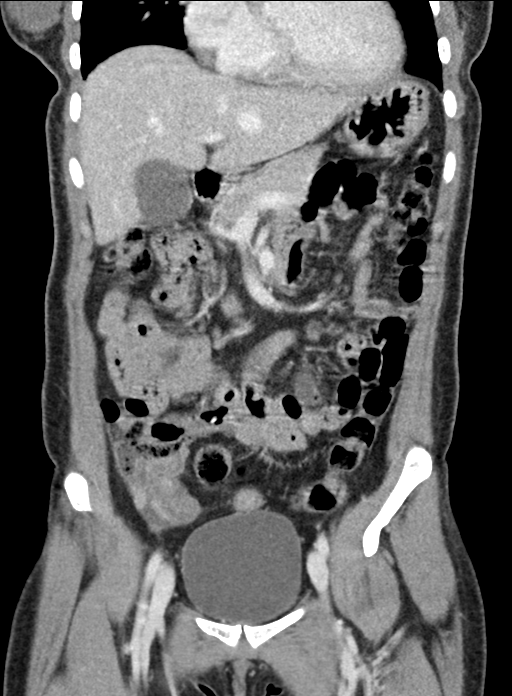
[im 53/96  soft-tissue]
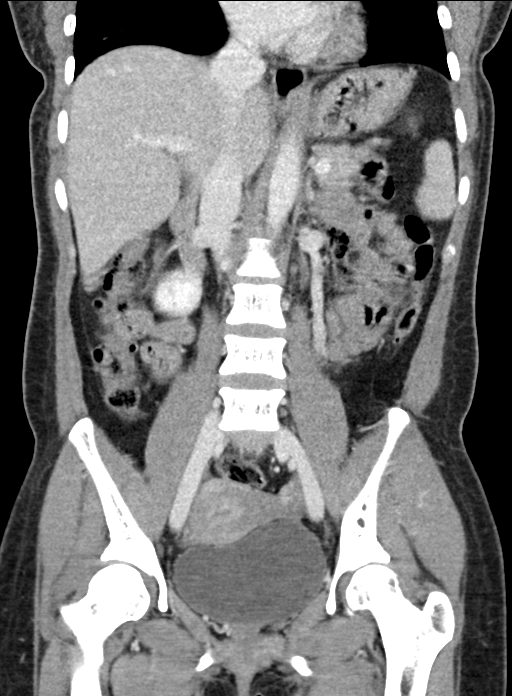

[16 of 46 positions shown; findings below may reference images not displayed]

FINDINGS: Lower chest: There are small bilateral pleural effusions. The
visualized lung bases are clear. The heart size is mildly enlarged.

Hepatobiliary: No focal liver abnormality is seen. No gallstones,
gallbladder wall thickening, or biliary dilatation.

Pancreas: Unremarkable. No pancreatic ductal dilatation or
surrounding inflammatory changes.

Spleen: Normal in size without focal abnormality.

Adrenals/Urinary Tract: Adrenal glands are unremarkable. Kidneys are
normal, without renal calculi, focal lesion, or hydronephrosis.
Bladder is unremarkable.

Stomach/Bowel: Stomach is within normal limits. Appendix appears
normal. No evidence of bowel wall thickening, distention, or
inflammatory changes.

Vascular/Lymphatic: No significant vascular findings are present. No
enlarged abdominal or pelvic lymph nodes.

Reproductive: There are several small cysts in normal size bilateral
ovaries. The right ovary is in the right lower quadrant anteriorly
located near the anterior abdominal wall. The uterus is normal.

Other: None.

Musculoskeletal: No acute or significant osseous findings.
IMPRESSION: No acute abnormality identified in the abdomen pelvis.

There is no hydronephrosis bilaterally.

There is no bowel obstruction.  The appendix is normal.

There are several small cysts in normal-sized bilateral ovaries.
These are likely follicular cyst. The right ovary is located
anteriorly in the right lower quadrant near the anterior abdominal
wall.

Small bilateral pleural effusions, nonspecific.

## 2024-04-11 ENCOUNTER — Other Ambulatory Visit: Payer: Self-pay

## 2024-04-11 ENCOUNTER — Emergency Department (HOSPITAL_COMMUNITY)

## 2024-04-11 ENCOUNTER — Emergency Department (HOSPITAL_COMMUNITY)
Admission: EM | Admit: 2024-04-11 | Discharge: 2024-04-11 | Disposition: A | Discharge status: Home / Self Care | Attending: Emergency Medicine | Admitting: Emergency Medicine

## 2024-04-11 ENCOUNTER — Encounter (HOSPITAL_COMMUNITY): Payer: Self-pay | Admitting: *Deleted

## 2024-04-11 DIAGNOSIS — K279 Peptic ulcer, site unspecified, unspecified as acute or chronic, without hemorrhage or perforation: Secondary | ICD-10-CM | POA: Insufficient documentation

## 2024-04-11 DIAGNOSIS — R109 Unspecified abdominal pain: Secondary | ICD-10-CM | POA: Diagnosis present

## 2024-04-11 LAB — URINALYSIS, ROUTINE W REFLEX MICROSCOPIC
Bilirubin Urine: NEGATIVE
Glucose, UA: NEGATIVE mg/dL
Hgb urine dipstick: NEGATIVE
Ketones, ur: NEGATIVE mg/dL
Leukocytes,Ua: NEGATIVE
Nitrite: NEGATIVE
Protein, ur: NEGATIVE mg/dL
Specific Gravity, Urine: 1.02 (ref 1.005–1.030)
pH: 6 (ref 5.0–8.0)

## 2024-04-11 LAB — CBC
HCT: 39.3 % (ref 36.0–46.0)
Hemoglobin: 12.8 g/dL (ref 12.0–15.0)
MCH: 24.7 pg — ABNORMAL LOW (ref 26.0–34.0)
MCHC: 32.6 g/dL (ref 30.0–36.0)
MCV: 75.7 fL — ABNORMAL LOW (ref 80.0–100.0)
Platelets: 272 K/uL (ref 150–400)
RBC: 5.19 MIL/uL — ABNORMAL HIGH (ref 3.87–5.11)
RDW: 14.2 % (ref 11.5–15.5)
WBC: 9.6 K/uL (ref 4.0–10.5)
nRBC: 0 % (ref 0.0–0.2)

## 2024-04-11 LAB — COMPREHENSIVE METABOLIC PANEL WITH GFR
ALT: 18 U/L (ref 0–44)
AST: 21 U/L (ref 15–41)
Albumin: 4.2 g/dL (ref 3.5–5.0)
Alkaline Phosphatase: 58 U/L (ref 38–126)
Anion gap: 10 (ref 5–15)
BUN: 9 mg/dL (ref 6–20)
CO2: 20 mmol/L — ABNORMAL LOW (ref 22–32)
Calcium: 9.6 mg/dL (ref 8.9–10.3)
Chloride: 109 mmol/L (ref 98–111)
Creatinine, Ser: 0.57 mg/dL (ref 0.44–1.00)
GFR, Estimated: >60 mL/min (ref >60)
Glucose, Bld: 96 mg/dL (ref 70–99)
Potassium: 3.6 mmol/L (ref 3.5–5.1)
Sodium: 139 mmol/L (ref 135–145)
Total Bilirubin: 0.8 mg/dL (ref 0.0–1.2)
Total Protein: 8.1 g/dL (ref 6.5–8.1)

## 2024-04-11 LAB — LIPASE, BLOOD: Lipase: 40 U/L (ref 11–51)

## 2024-04-11 LAB — HCG, SERUM, QUALITATIVE: Preg, Serum: NEGATIVE

## 2024-04-11 MED ORDER — ONDANSETRON 4 MG PO TBDP: 4.0000 mg | ORAL_TABLET | Freq: Three times a day (TID) | ORAL | 0 refills | Status: AC | PRN

## 2024-04-11 MED ORDER — IOHEXOL 350 MG/ML SOLN
75.0000 mL | Freq: Once | INTRAVENOUS | Status: AC | PRN
  Administered 2024-04-11: 75 mL via INTRAVENOUS

## 2024-04-11 MED ORDER — OMEPRAZOLE 40 MG PO CPDR: 40.0000 mg | DELAYED_RELEASE_CAPSULE | Freq: Every day | ORAL | 0 refills | Status: AC

## 2024-04-11 MED ORDER — MORPHINE SULFATE (PF) 4 MG/ML IV SOLN
4.0000 mg | Freq: Once | INTRAVENOUS | Status: AC
  Administered 2024-04-11: 4 mg via INTRAVENOUS
  Filled 2024-04-11: qty 1

## 2024-04-11 MED ORDER — ONDANSETRON HCL 4 MG/2ML IJ SOLN
4.0000 mg | Freq: Once | INTRAMUSCULAR | Status: AC
  Administered 2024-04-11: 4 mg via INTRAVENOUS
  Filled 2024-04-11: qty 2

## 2024-04-11 NOTE — Discharge Instructions (Signed)
 As discussed, I have sent a referral for gastroenterology.  Please also follow-up with your primary care provider.  If the gastroenterologist does not call you by next Wednesday, you should ask your primary care provider to send another referral.  Seek emergency care if experiencing any new or worsening symptoms.  Nh? ? th?o lu?n, ti ? g?i gi?y gi?i thi?u ??n khoa tiu ha. Vui lng theo di thm v?i bc s? ch?m [place] chnh c?a b?n. N?u bc s? tiu ha khng g?i cho b?n tr??c th? T? tu?n t?i, b?n nn yu c?u bc s? ch?m [place] chnh g?i gi?y gi?i thi?u khc. Hy tm ki?m s? ch?m [place] kh?n c?p n?u g?p b?t k? tri?u ch?ng m?i ho?c tri?u ch?ng tr? n?ng.

## 2024-04-11 NOTE — ED Provider Notes (Signed)
 [place] EMERGENCY DEPARTMENT AT [facility] Provider Note   CSN: [id number] Arrival date & time: 04/11/24  9144     Patient presents with: Abdominal Pain   Allison Chan is a 33 y.o. female with PMHx GERD, headaches, who presents to ED concerned for intermittent RUQ abdominal pain x1-2 weeks. Pain is worse after eating and resolves after bowel movements. Patient also with intermittent nausea and vomiting. Denies hematemesis, hematochezia, fever, dysuria, hematuria. Last BM today was normal. Patient has been taking GERD medications x1 week without resolution in symptoms. Patient has not taken any medication for pain today.  [place] translator Ozell #[phone] assisting interview. Patient's primary language is Mont[place], but this translator was not available on the phone and patient stating that she also speaks vietnamese.  I was eventually able to talk with the [place] translator - Keo - who was able to go over results with patient.     Abdominal Pain      Prior to Admission medications   Medication Sig Start Date End Date Taking? Authorizing Provider  ondansetron  (ZOFRAN -ODT) 4 MG disintegrating tablet Take 1 tablet (4 mg total) by mouth every 8 (eight) hours as needed for nausea. 04/11/24  Yes Hoy Fraction F, PA-C  acetaminophen (TYLENOL) 325 MG tablet Take 650 mg by mouth every 6 (six) hours as needed for mild pain.    [provider]  omeprazole  (PRILOSEC) 40 MG capsule Take 1 capsule (40 mg total) by mouth daily. 04/11/24   Hoy Fraction FALCON, PA-C  sucralfate  (CARAFATE ) 1 g tablet Take 1 tablet (1 g total) by mouth 4 (four) times daily -  with meals and at bedtime. Patient not taking: Reported on 10/01/2018 03/15/18   Doretha Folks, MD  SUMAtriptan  (IMITREX ) 50 MG tablet Take 1 tablet (50 mg total) by mouth every 2 (two) hours as needed for migraine. May repeat in 2 hours if headache persists or recurs. Patient not taking: Reported on 10/01/2018  03/18/18   Onita Duos, MD    Allergies: Patient has no known allergies.    Review of Systems  Gastrointestinal:  Positive for abdominal pain.    Updated Vital Signs BP 133/80 (BP Location: Left Arm)   Pulse 69   Temp 97.7 F (36.5 C) (Oral)   Resp 16   Ht 4' 8 (1.422 m)   Wt 52 kg   SpO2 100% Comment: Simultaneous filing. User may not have seen previous data.  BMI 25.70 kg/m   Physical Exam Vitals and nursing note reviewed.  Constitutional:      General: She is not in acute distress.    Appearance: She is not ill-appearing or toxic-appearing.  HENT:     Head: Normocephalic and atraumatic.     Mouth/Throat:     Mouth: Mucous membranes are moist.     Pharynx: No posterior oropharyngeal erythema.  Eyes:     General: No scleral icterus.       Right eye: No discharge.        Left eye: No discharge.     Conjunctiva/sclera: Conjunctivae normal.  Cardiovascular:     Rate and Rhythm: Normal rate and regular rhythm.     Pulses: Normal pulses.     Heart sounds: Normal heart sounds. No murmur heard. Pulmonary:     Effort: Pulmonary effort is normal. No respiratory distress.     Breath sounds: Normal breath sounds. No wheezing, rhonchi or rales.  Abdominal:     General: Abdomen is flat. Bowel sounds are normal.  There is no distension.     Palpations: Abdomen is soft. There is no mass.     Tenderness: There is abdominal tenderness in the right upper quadrant.  Musculoskeletal:     Right lower leg: No edema.     Left lower leg: No edema.  Skin:    General: Skin is warm and dry.     Findings: No rash.  Neurological:     General: No focal deficit present.     Mental Status: She is alert and oriented to person, place, and time. Mental status is at baseline.  Psychiatric:        Mood and Affect: Mood normal.        Behavior: Behavior normal.     (all labs ordered are listed, but only abnormal results are displayed) Labs Reviewed  COMPREHENSIVE METABOLIC PANEL WITH GFR -  Abnormal; Notable for the following components:      Result Value   CO2 20 (*)    All other components within normal limits  CBC - Abnormal; Notable for the following components:   RBC 5.19 (*)    MCV 75.7 (*)    MCH 24.7 (*)    All other components within normal limits  LIPASE, BLOOD  URINALYSIS, ROUTINE W REFLEX MICROSCOPIC  HCG, SERUM, QUALITATIVE    EKG: None  Radiology: US  Abdomen Limited RUQ (LIVER/GB) Result Date: 04/11/2024 EXAM: Right Upper Quadrant Abdominal Ultrasound TECHNIQUE: Real-time ultrasonography of the right upper quadrant of the abdomen was performed. COMPARISON: CT of earlier today. CLINICAL HISTORY: RUQ pain. FINDINGS: LIVER: The liver demonstrates normal echogenicity. No intrahepatic biliary ductal dilatation. No mass. BILIARY SYSTEM: No evidence of pericholecystic fluid or wall thickening. No cholelithiasis. Negative sonographic Murphy's sign. Common bile duct is within normal limits measuring 2 mm. OTHER: No right upper quadrant ascites. IMPRESSION: 1. No acute findings. Electronically signed by: Rockey Kilts MD 04/11/2024 01:46 PM EDT RP Workstation: [id number]   CT ABDOMEN PELVIS W CONTRAST Result Date: 04/11/2024 CLINICAL DATA:  Acute abdominal pain, nausea and vomiting. EXAM: CT ABDOMEN AND PELVIS WITH CONTRAST TECHNIQUE: Multidetector CT imaging of the abdomen and pelvis was performed using the standard protocol following bolus administration of intravenous contrast. RADIATION DOSE REDUCTION: This exam was performed according to the departmental dose-optimization program which includes automated exposure control, adjustment of the mA and/or kV according to patient size and/or use of iterative reconstruction technique. CONTRAST:  75mL OMNIPAQUE  IOHEXOL  350 MG/ML SOLN COMPARISON:  March 15, 2018. FINDINGS: Lower chest: No acute abnormality. Hepatobiliary: No focal liver abnormality is seen. No gallstones, gallbladder wall thickening, or biliary dilatation. Pancreas:  Unremarkable. No pancreatic ductal dilatation or surrounding inflammatory changes. Spleen: Normal in size without focal abnormality. Adrenals/Urinary Tract: Adrenal glands and kidneys appear normal. No hydronephrosis or renal obstruction is noted. Mild urinary bladder distention is noted. Stomach/Bowel: Stomach is within normal limits. Appendix appears normal. No evidence of bowel wall thickening, distention, or inflammatory changes. Vascular/Lymphatic: No significant vascular findings are present. No enlarged abdominal or pelvic lymph nodes. Reproductive: Uterus and bilateral adnexa are unremarkable. Other: No ascites or hernia is noted. Musculoskeletal: No acute or significant osseous findings. IMPRESSION: Mild urinary bladder distention. No other abnormality seen in the abdomen or pelvis. Electronically Signed   By: Lynwood Landy Raddle M.D.   On: 04/11/2024 12:28     Procedures   Medications Ordered in the ED  morphine  (PF) 4 MG/ML injection 4 mg (4 mg Intravenous Given 04/11/24 1041)  ondansetron  (ZOFRAN ) injection 4 mg (  4 mg Intravenous Given 04/11/24 1040)  iohexol  (OMNIPAQUE ) 350 MG/ML injection 75 mL (75 mLs Intravenous Contrast Given 04/11/24 1216)                                    Medical Decision Making Amount and/or Complexity of Data Reviewed Labs: ordered. Radiology: ordered.  Risk Prescription drug management.    This patient presents to the ED for concern of abdominal pain, this involves an extensive number of treatment options, and is a complaint that carries with it a high risk of complications and morbidity.  The differential diagnosis includes gastroenteritis, colitis, small bowel obstruction, appendicitis, cholecystitis, pancreatitis, nephrolithiasis, UTI, pyelonephritis   Co morbidities that complicate the patient evaluation  GERD, headaches   Additional history obtained:  No PCP listed in chart   Problem List / ED Course / Critical interventions / Medication  management  Patient presented for abdominal pain.  Also with intermittent nausea and vomiting.  No episodes of vomiting today.  Physical exam with RUQ tenderness to palpation.  Rest of physical exam reassuring.  Patient afebrile with stable vitals. I Ordered, and personally interpreted labs.  UA not concerning for infection.  hCG negative.  CBC without leukocytosis or anemia.  CMP with mildly low CO2 at 20.  Lipase within normal limits. I ordered imaging studies including CT Abd/Pelvis and RUQ US . I independently visualized and interpreted imaging and I agree with the radiologist interpretation of no acute process. Workup today along with history is most concerning for peptic ulcer disease. Shared all results with patient.  Answered all questions.  I will send a referral for GI to further assess for PUD.  Patient understands to continue taking her Protonix.  I will also prescribe patient with Zofran  to help with the nausea and vomiting.  Patient verbalized understanding of plan.  Patient states she also has a primary care provider that she will be able to follow-up with. I have reviewed the patients home medicines and have made adjustments as needed The patient has been appropriately medically screened and/or stabilized in the ED. I have low suspicion for any other emergent medical condition which would require further screening, evaluation or treatment in the ED or require inpatient management. At time of discharge the patient is hemodynamically stable and in no acute distress. I have discussed work-up results and diagnosis with patient and answered all questions. Patient is agreeable with discharge plan. We discussed strict return precautions for returning to the emergency department and they verbalized understanding.     Social Determinants of Health:  Foreign language.       Final diagnoses:  PUD (peptic ulcer disease)    ED Discharge Orders          Ordered    ondansetron  (ZOFRAN -ODT)  4 MG disintegrating tablet  Every 8 hours PRN        04/11/24 1433    omeprazole  (PRILOSEC) 40 MG capsule  Daily        04/11/24 1433    Ambulatory referral to Gastroenterology       Comments: Patient speaks [place]. We have a translator service who can be reached during normal business hours at [phone]. There is also a pager number at [phone]. If these two numbers do not work, Ivey is an employee with this translating service who can be reached at [phone].   04/11/24 1436  Hoy Nidia FALCON, [place] 04/11/24 1441    Patsey Lot, MD 04/15/24 [phone]

## 2024-04-11 NOTE — ED Triage Notes (Signed)
 C/o abd. Pain with occ. N/v states the is worse after eating, was seen at [facility] 1 week ago and given medication for reflux states its not helping.  Denies urinary sx. LBM was this am, states she feels a litter better after going however then the pain returns.
